# Patient Record
Sex: Male | Born: 1960 | Race: Black or African American | Hispanic: No | Marital: Married | Smoking: Never smoker
Health system: Southern US, Community
[De-identification: ages and names within clinical notes are randomized; demographics above are authoritative.]

## PROBLEM LIST (undated history)

## (undated) DIAGNOSIS — C801 Malignant (primary) neoplasm, unspecified: Secondary | ICD-10-CM

## (undated) DIAGNOSIS — G4730 Sleep apnea, unspecified: Secondary | ICD-10-CM

## (undated) DIAGNOSIS — Z87442 Personal history of urinary calculi: Secondary | ICD-10-CM

## (undated) HISTORY — PX: OTHER SURGICAL HISTORY: SHX169

---

## 2000-06-30 ENCOUNTER — Encounter: Admission: RE | Admit: 2000-06-30 | Discharge: 2000-06-30 | Payer: Self-pay | Admitting: Cardiology

## 2000-06-30 ENCOUNTER — Encounter: Payer: Self-pay | Admitting: Cardiology

## 2000-11-10 ENCOUNTER — Other Ambulatory Visit: Admission: RE | Admit: 2000-11-10 | Discharge: 2000-11-10 | Payer: Self-pay | Admitting: Urology

## 2000-12-16 ENCOUNTER — Encounter: Payer: Self-pay | Admitting: Urology

## 2000-12-16 ENCOUNTER — Encounter: Admission: RE | Admit: 2000-12-16 | Discharge: 2000-12-16 | Payer: Self-pay | Admitting: Urology

## 2002-10-28 ENCOUNTER — Ambulatory Visit (HOSPITAL_COMMUNITY): Admission: RE | Admit: 2002-10-28 | Discharge: 2002-10-28 | Payer: Self-pay | Admitting: Otolaryngology

## 2004-04-16 DIAGNOSIS — C679 Malignant neoplasm of bladder, unspecified: Secondary | ICD-10-CM

## 2004-04-16 HISTORY — DX: Malignant neoplasm of bladder, unspecified: C67.9

## 2004-04-28 ENCOUNTER — Ambulatory Visit (HOSPITAL_COMMUNITY): Admission: RE | Admit: 2004-04-28 | Discharge: 2004-04-28 | Payer: Self-pay | Admitting: Urology

## 2004-04-28 ENCOUNTER — Ambulatory Visit (HOSPITAL_BASED_OUTPATIENT_CLINIC_OR_DEPARTMENT_OTHER): Admission: RE | Admit: 2004-04-28 | Discharge: 2004-04-28 | Payer: Self-pay | Admitting: Urology

## 2005-06-29 ENCOUNTER — Ambulatory Visit (HOSPITAL_BASED_OUTPATIENT_CLINIC_OR_DEPARTMENT_OTHER): Admission: RE | Admit: 2005-06-29 | Discharge: 2005-06-29 | Payer: Self-pay | Admitting: Specialist

## 2005-06-29 ENCOUNTER — Ambulatory Visit (HOSPITAL_COMMUNITY): Admission: RE | Admit: 2005-06-29 | Discharge: 2005-06-29 | Payer: Self-pay | Admitting: Specialist

## 2012-06-15 ENCOUNTER — Other Ambulatory Visit: Payer: Self-pay | Admitting: Cardiology

## 2012-06-15 ENCOUNTER — Ambulatory Visit
Admission: RE | Admit: 2012-06-15 | Discharge: 2012-06-15 | Disposition: A | Discharge status: Home / Self Care | Payer: BC Managed Care – PPO | Source: Ambulatory Visit | Attending: Cardiology | Admitting: Cardiology

## 2012-06-15 DIAGNOSIS — R079 Chest pain, unspecified: Secondary | ICD-10-CM

## 2012-06-15 DIAGNOSIS — R0600 Dyspnea, unspecified: Secondary | ICD-10-CM

## 2012-06-30 ENCOUNTER — Other Ambulatory Visit: Payer: Self-pay | Admitting: Cardiology

## 2012-06-30 DIAGNOSIS — R918 Other nonspecific abnormal finding of lung field: Secondary | ICD-10-CM

## 2012-07-14 ENCOUNTER — Other Ambulatory Visit: Payer: Self-pay | Admitting: Gastroenterology

## 2012-08-08 ENCOUNTER — Other Ambulatory Visit: Payer: BC Managed Care – PPO

## 2014-03-16 ENCOUNTER — Other Ambulatory Visit: Payer: Self-pay | Admitting: Cardiology

## 2014-03-16 ENCOUNTER — Ambulatory Visit
Admission: RE | Admit: 2014-03-16 | Discharge: 2014-03-16 | Disposition: A | Discharge status: Home / Self Care | Payer: BC Managed Care – PPO | Source: Ambulatory Visit | Attending: Cardiology | Admitting: Cardiology

## 2014-03-16 DIAGNOSIS — I10 Essential (primary) hypertension: Secondary | ICD-10-CM

## 2014-11-09 IMAGING — CR DG CHEST 2V
2 series · 2 of 2 positions shown · non-contrast
Comparison: PA and lateral chest 06/15/2012.

CLINICAL DATA: Hypertension.  Cough for 2 weeks.  Chest discomfort.

EXAM:
CHEST  2 VIEW

[w chest pa]
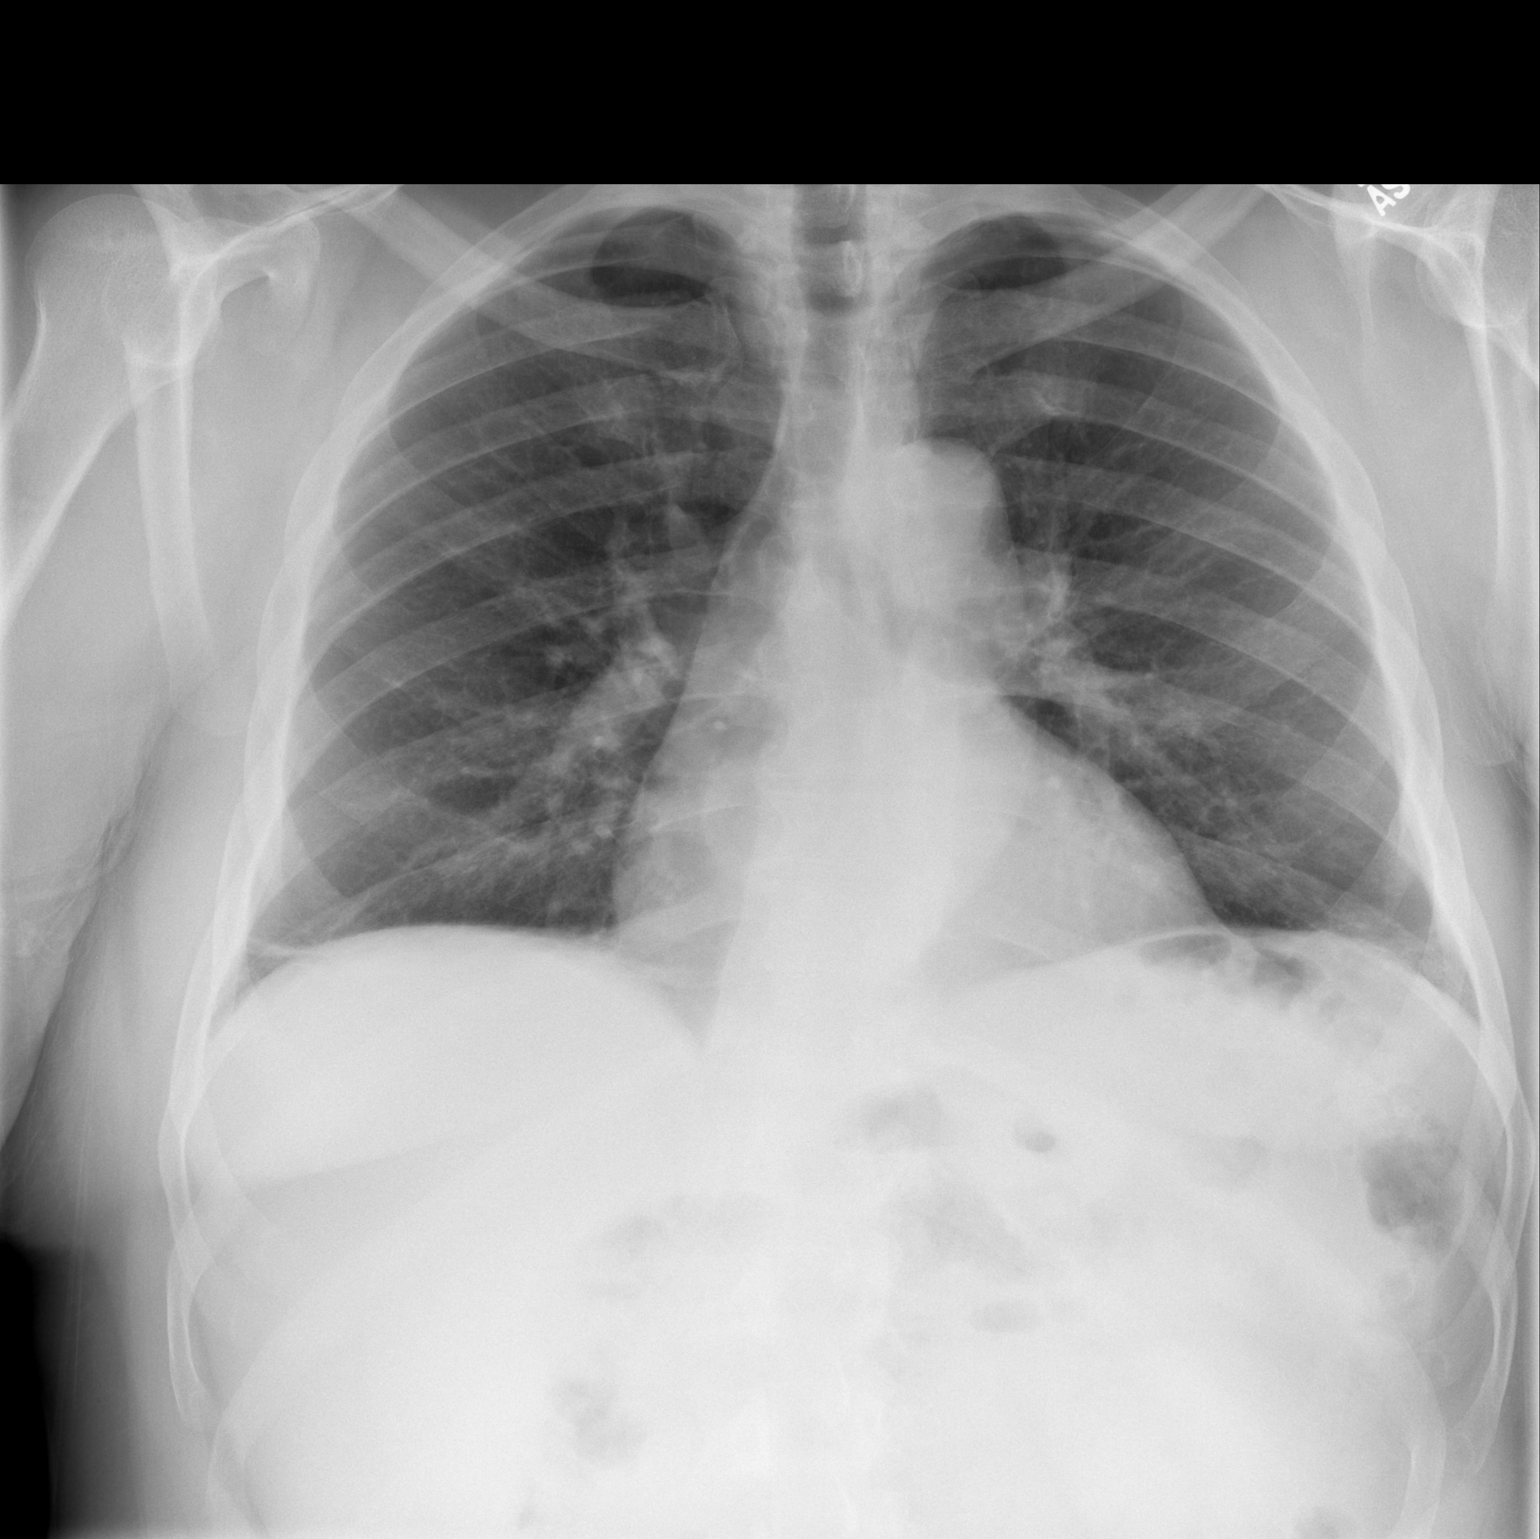

[w chest lat]
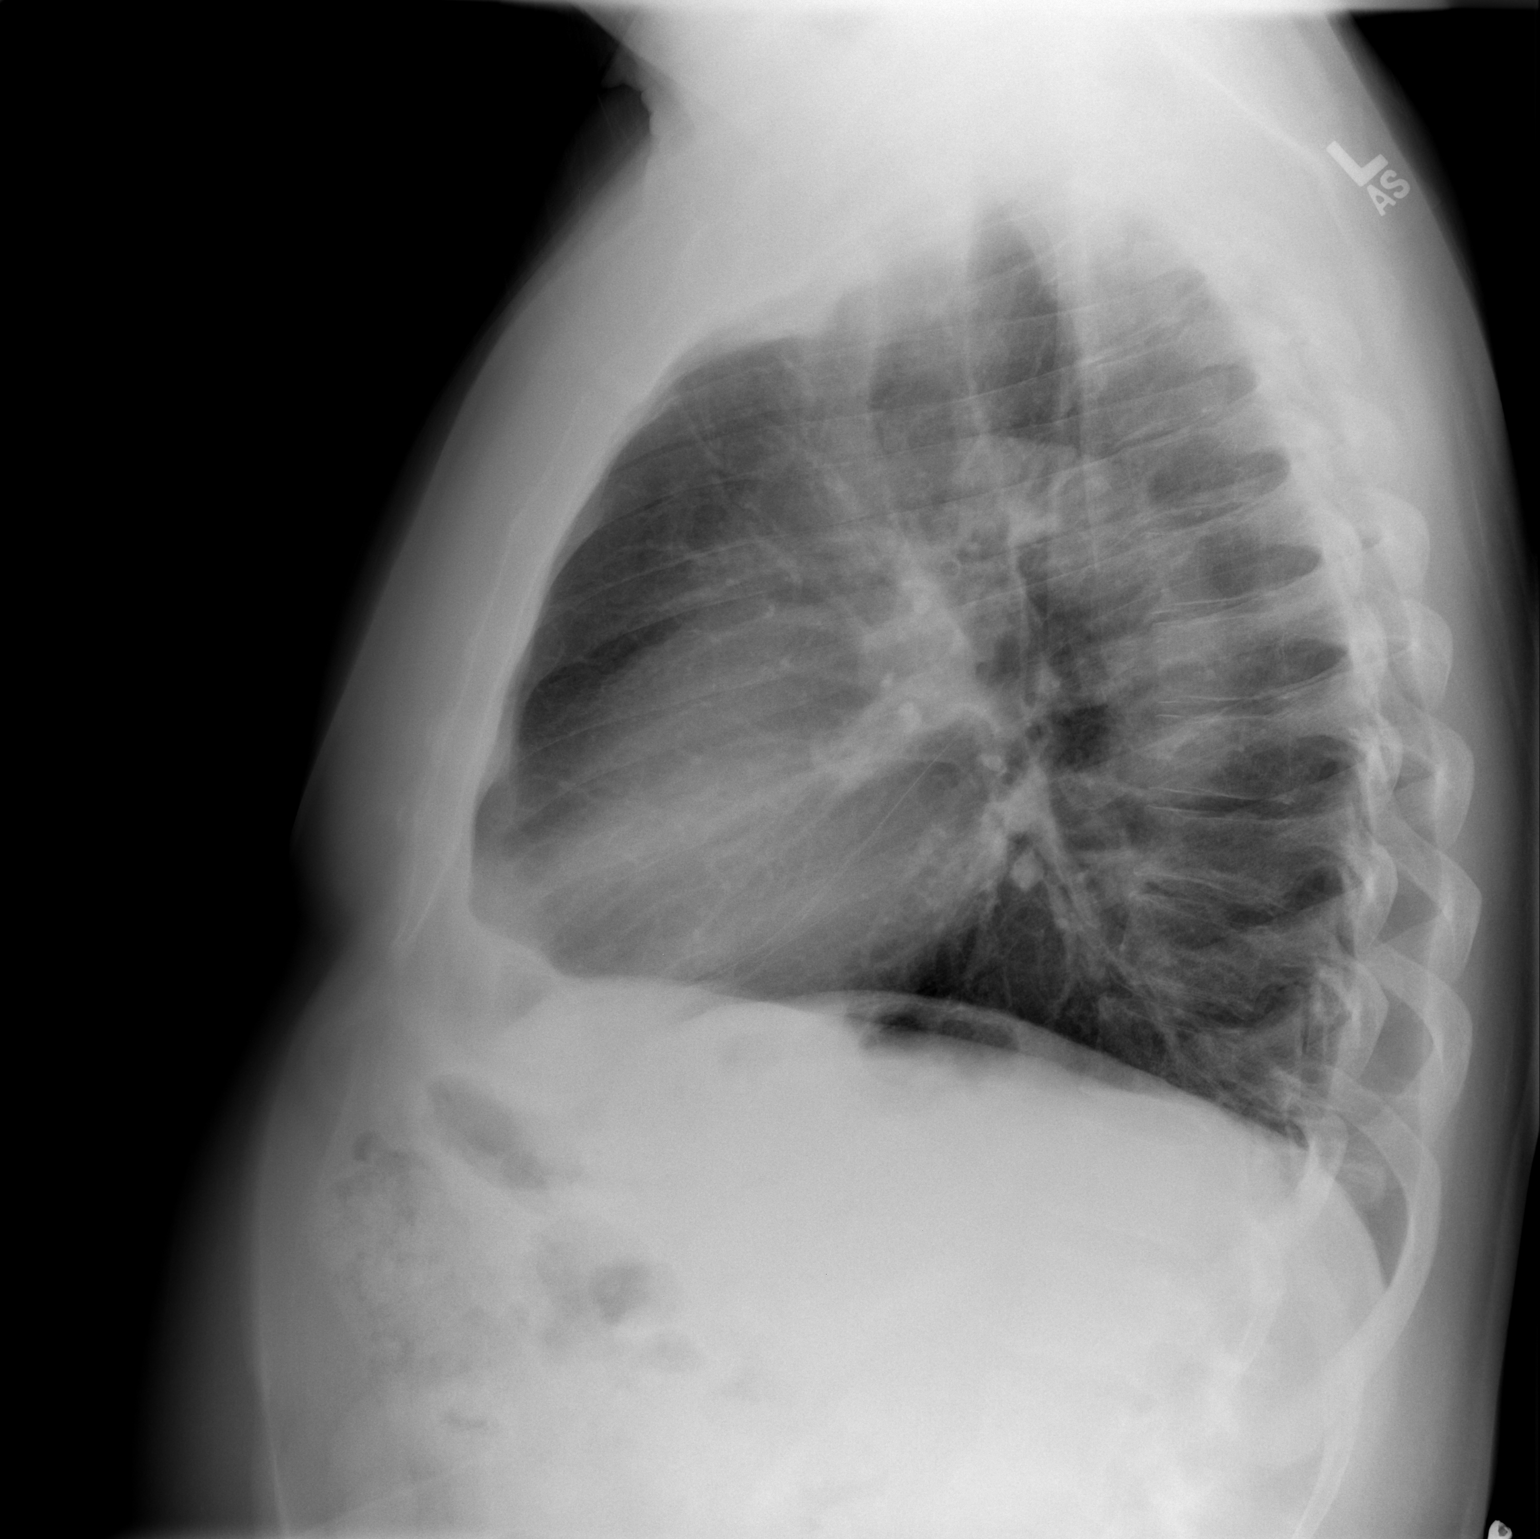

[2 of 2 positions shown; findings below may reference images not displayed]

FINDINGS: Very mild linear atelectasis is present in the bases. The lungs are
otherwise clear. Heart size is normal. No pneumothorax or pleural
effusion.
IMPRESSION: No acute disease.

## 2020-02-08 ENCOUNTER — Ambulatory Visit: Payer: Self-pay | Attending: Internal Medicine

## 2020-02-08 DIAGNOSIS — Z23 Encounter for immunization: Secondary | ICD-10-CM

## 2020-02-08 NOTE — Progress Notes (Signed)
   Covid-19 Vaccination Clinic  Name:  Joel [name]    MRN: [id number] DOB: 18-Jul-1961  02/08/2020  [name] was observed post Covid-19 immunization for 15 minutes without incident. He was provided with Vaccine Information Sheet and instruction to access the V-Safe system.   [name] was instructed to call 911 with any severe reactions post vaccine: Marland Kitchen Difficulty breathing  . Swelling of face and throat  . A fast heartbeat  . A bad rash all over body  . Dizziness and weakness   Immunizations Administered    Name Date Dose VIS Date Route   Pfizer COVID-19 Vaccine 02/08/2020  3:15 PM 0.3 mL 10/27/2019 Intramuscular   Manufacturer: [employer], [employer]   Lot: [id number]   NDC: [address]-2

## 2020-03-04 ENCOUNTER — Ambulatory Visit: Payer: Self-pay | Attending: Internal Medicine

## 2020-03-04 DIAGNOSIS — Z23 Encounter for immunization: Secondary | ICD-10-CM

## 2020-03-04 NOTE — Progress Notes (Signed)
   Covid-19 Vaccination Clinic  Name:  Joel [name]    MRN: [id number] DOB: 1961/09/12  03/04/2020  Mr. Joel [name] was observed post Covid-19 immunization for 15 minutes without incident. He was provided with Vaccine Information Sheet and instruction to access the V-Safe system.   Mr. Joel [name] was instructed to call 911 with any severe reactions post vaccine: Marland Kitchen Difficulty breathing  . Swelling of face and throat  . A fast heartbeat  . A bad rash all over body  . Dizziness and weakness   Immunizations Administered    Name Date Dose VIS Date Route   Pfizer COVID-19 Vaccine 03/04/2020  3:11 PM 0.3 mL 01/10/2019 Intramuscular   Manufacturer: [employer], [employer]   Lot: [id number]   NDC: [address]-2

## 2020-11-16 HISTORY — PX: SHOULDER SURGERY: SHX246

## 2023-08-06 ENCOUNTER — Other Ambulatory Visit: Payer: Self-pay | Admitting: Urology

## 2023-08-26 ENCOUNTER — Encounter (HOSPITAL_BASED_OUTPATIENT_CLINIC_OR_DEPARTMENT_OTHER): Payer: Self-pay | Admitting: Urology

## 2023-08-26 ENCOUNTER — Other Ambulatory Visit: Payer: Self-pay

## 2023-08-26 NOTE — Progress Notes (Signed)
Spoke w/ via phone for pre-op interview---pt Lab needs dos---- I stat (gent)        Lab results------ COVID test -----patient states asymptomatic no test needed Arrive at -------1045 09-03-2023 NPO after MN NO Solid Food.  Clear liquids from MN until---945 Med rec completed Medications to take morning of surgery -----tamsulosin Diabetic medication -----n/a Patient instructed no nail polish to be worn day of surgery Patient instructed to bring photo id and insurance card day of surgery Patient aware to have Driver (ride ) / caregiver    for 24 hours after surgery - Joel [name] wife Patient Special Instructions -----none Pre-Op special Instructions -----none Patient verbalized understanding of instructions that were given at this phone interview. Patient denies chest pain, sob, fever, cough at the interview.

## 2023-09-03 ENCOUNTER — Other Ambulatory Visit: Payer: Self-pay

## 2023-09-03 ENCOUNTER — Encounter (HOSPITAL_BASED_OUTPATIENT_CLINIC_OR_DEPARTMENT_OTHER)
Admission: RE | Disposition: A | Discharge status: Home / Self Care | Payer: Self-pay | Source: Home / Self Care | Attending: Urology

## 2023-09-03 ENCOUNTER — Ambulatory Visit (HOSPITAL_BASED_OUTPATIENT_CLINIC_OR_DEPARTMENT_OTHER): Payer: 59 | Admitting: Anesthesiology

## 2023-09-03 ENCOUNTER — Encounter (HOSPITAL_BASED_OUTPATIENT_CLINIC_OR_DEPARTMENT_OTHER): Payer: Self-pay | Admitting: Urology

## 2023-09-03 ENCOUNTER — Ambulatory Visit (HOSPITAL_BASED_OUTPATIENT_CLINIC_OR_DEPARTMENT_OTHER)
Admission: RE | Admit: 2023-09-03 | Discharge: 2023-09-03 | Disposition: A | Discharge status: Home / Self Care | Payer: 59 | Attending: Urology | Admitting: Urology

## 2023-09-03 DIAGNOSIS — Z6841 Body Mass Index (BMI) 40.0 and over, adult: Secondary | ICD-10-CM | POA: Diagnosis not present

## 2023-09-03 DIAGNOSIS — Z8551 Personal history of malignant neoplasm of bladder: Secondary | ICD-10-CM | POA: Diagnosis not present

## 2023-09-03 DIAGNOSIS — N202 Calculus of kidney with calculus of ureter: Secondary | ICD-10-CM

## 2023-09-03 DIAGNOSIS — E669 Obesity, unspecified: Secondary | ICD-10-CM | POA: Diagnosis not present

## 2023-09-03 DIAGNOSIS — G4730 Sleep apnea, unspecified: Secondary | ICD-10-CM | POA: Insufficient documentation

## 2023-09-03 DIAGNOSIS — Z87442 Personal history of urinary calculi: Secondary | ICD-10-CM | POA: Diagnosis not present

## 2023-09-03 DIAGNOSIS — Z01818 Encounter for other preprocedural examination: Secondary | ICD-10-CM

## 2023-09-03 HISTORY — DX: Malignant (primary) neoplasm, unspecified: C80.1

## 2023-09-03 HISTORY — PX: CYSTOSCOPY/URETEROSCOPY/HOLMIUM LASER/STENT PLACEMENT: SHX6546

## 2023-09-03 HISTORY — DX: Sleep apnea, unspecified: G47.30

## 2023-09-03 HISTORY — DX: Personal history of urinary calculi: Z87.442

## 2023-09-03 LAB — POCT I-STAT, CHEM 8
BUN: 15 mg/dL (ref 8–23)
Calcium, Ion: 1.19 mmol/L (ref 1.15–1.40)
Chloride: 105 mmol/L (ref 98–111)
Creatinine, Ser: 1.2 mg/dL (ref 0.61–1.24)
Glucose, Bld: 96 mg/dL (ref 70–99)
HCT: 40 % (ref 39.0–52.0)
Hemoglobin: 13.6 g/dL (ref 13.0–17.0)
Potassium: 4.2 mmol/L (ref 3.5–5.1)
Sodium: 141 mmol/L (ref 135–145)
TCO2: 21 mmol/L — ABNORMAL LOW (ref 22–32)

## 2023-09-03 SURGERY — CYSTOSCOPY/URETEROSCOPY/HOLMIUM LASER/STENT PLACEMENT
Anesthesia: General | Site: Ureter | Laterality: Bilateral

## 2023-09-03 MED ORDER — PHENYLEPHRINE HCL (PRESSORS) 10 MG/ML IV SOLN
INTRAVENOUS | Status: AC
  Filled 2023-09-03: qty 1

## 2023-09-03 MED ORDER — GENTAMICIN SULFATE 40 MG/ML IJ SOLN
5.0000 mg/kg | INTRAVENOUS | Status: AC
  Administered 2023-09-03: 480 mg via INTRAVENOUS
  Filled 2023-09-03: qty 12

## 2023-09-03 MED ORDER — LACTATED RINGERS IV SOLN: INTRAVENOUS | Status: DC

## 2023-09-03 MED ORDER — MIDAZOLAM HCL 5 MG/5ML IJ SOLN
INTRAMUSCULAR | Status: DC | PRN
  Administered 2023-09-03: 2 mg via INTRAVENOUS

## 2023-09-03 MED ORDER — LIDOCAINE HCL (PF) 2 % IJ SOLN
INTRAMUSCULAR | Status: AC
  Filled 2023-09-03: qty 5

## 2023-09-03 MED ORDER — PHENYLEPHRINE HCL-NACL 20-0.9 MG/250ML-% IV SOLN
INTRAVENOUS | Status: DC | PRN
Start: 2023-09-03 — End: 2023-09-03
  Administered 2023-09-03: 20 ug/min via INTRAVENOUS

## 2023-09-03 MED ORDER — OXYCODONE-ACETAMINOPHEN 5-325 MG PO TABS: 1.0000 | ORAL_TABLET | Freq: Four times a day (QID) | ORAL | 0 refills | Status: DC | PRN

## 2023-09-03 MED ORDER — SODIUM CHLORIDE 0.9 % IR SOLN
Status: DC | PRN
  Administered 2023-09-03: 3000 mL

## 2023-09-03 MED ORDER — ONDANSETRON HCL 4 MG/2ML IJ SOLN
INTRAMUSCULAR | Status: AC
  Filled 2023-09-03: qty 2

## 2023-09-03 MED ORDER — MIDAZOLAM HCL 2 MG/2ML IJ SOLN
INTRAMUSCULAR | Status: AC
  Filled 2023-09-03: qty 2

## 2023-09-03 MED ORDER — PHENYLEPHRINE HCL (PRESSORS) 10 MG/ML IV SOLN
INTRAVENOUS | Status: DC | PRN
Start: 2023-09-03 — End: 2023-09-03
  Administered 2023-09-03: 240 ug via INTRAVENOUS
  Administered 2023-09-03: 80 ug via INTRAVENOUS

## 2023-09-03 MED ORDER — LIDOCAINE HCL (CARDIAC) PF 100 MG/5ML IV SOSY
PREFILLED_SYRINGE | INTRAVENOUS | Status: DC | PRN
  Administered 2023-09-03: 100 mg via INTRAVENOUS

## 2023-09-03 MED ORDER — FENTANYL CITRATE (PF) 100 MCG/2ML IJ SOLN
INTRAMUSCULAR | Status: DC | PRN
  Administered 2023-09-03: 25 ug via INTRAVENOUS

## 2023-09-03 MED ORDER — ACETAMINOPHEN 10 MG/ML IV SOLN
INTRAVENOUS | Status: AC
  Filled 2023-09-03: qty 100

## 2023-09-03 MED ORDER — ACETAMINOPHEN 500 MG PO TABS: 1000.0000 mg | ORAL_TABLET | Freq: Once | ORAL | Status: DC

## 2023-09-03 MED ORDER — PROPOFOL 10 MG/ML IV BOLUS
INTRAVENOUS | Status: DC | PRN
  Administered 2023-09-03: 200 mg via INTRAVENOUS

## 2023-09-03 MED ORDER — SENNOSIDES-DOCUSATE SODIUM 8.6-50 MG PO TABS: 1.0000 | ORAL_TABLET | Freq: Two times a day (BID) | ORAL | 0 refills | Status: AC

## 2023-09-03 MED ORDER — DEXAMETHASONE SODIUM PHOSPHATE 10 MG/ML IJ SOLN
INTRAMUSCULAR | Status: AC
  Filled 2023-09-03: qty 1

## 2023-09-03 MED ORDER — IOHEXOL 300 MG/ML  SOLN
INTRAMUSCULAR | Status: DC | PRN
  Administered 2023-09-03: 8 mL via URETHRAL

## 2023-09-03 MED ORDER — DEXAMETHASONE SODIUM PHOSPHATE 4 MG/ML IJ SOLN
INTRAMUSCULAR | Status: DC | PRN
  Administered 2023-09-03: 10 mg via INTRAVENOUS

## 2023-09-03 MED ORDER — FENTANYL CITRATE (PF) 100 MCG/2ML IJ SOLN
INTRAMUSCULAR | Status: AC
  Filled 2023-09-03: qty 2

## 2023-09-03 MED ORDER — ONDANSETRON HCL 4 MG/2ML IJ SOLN
INTRAMUSCULAR | Status: DC | PRN
  Administered 2023-09-03: 4 mg via INTRAVENOUS

## 2023-09-03 MED ORDER — 0.9 % SODIUM CHLORIDE (POUR BTL) OPTIME
TOPICAL | Status: DC | PRN
  Administered 2023-09-03: 500 mL

## 2023-09-03 MED ORDER — KETOROLAC TROMETHAMINE 10 MG PO TABS: 10.0000 mg | ORAL_TABLET | Freq: Four times a day (QID) | ORAL | 0 refills | Status: DC | PRN

## 2023-09-03 MED ORDER — ACETAMINOPHEN 10 MG/ML IV SOLN
INTRAVENOUS | Status: DC | PRN
Start: 2023-09-03 — End: 2023-09-03
  Administered 2023-09-03: 1000 mg via INTRAVENOUS

## 2023-09-03 SURGICAL SUPPLY — 29 items
BAG DRAIN URO-CYSTO SKYTR STRL (DRAIN) ×1 IMPLANT
BAG DRN UROCATH (DRAIN) ×1
BASKET LASER NITINOL 1.9FR (BASKET) IMPLANT
BSKT STON RTRVL 120 1.9FR (BASKET)
CATH URETL OPEN END 6FR 70 (CATHETERS) IMPLANT
CLOTH BEACON ORANGE TIMEOUT ST (SAFETY) ×1 IMPLANT
GLOVE BIO SURGEON STRL SZ7.5 (GLOVE) ×1 IMPLANT
GLOVE BIOGEL PI IND STRL 7.0 (GLOVE) IMPLANT
GLOVE SURG SS PI 6.5 STRL IVOR (GLOVE) IMPLANT
GOWN STRL REUS W/ TWL LRG LVL3 (GOWN DISPOSABLE) IMPLANT
GOWN STRL REUS W/TWL LRG LVL3 (GOWN DISPOSABLE) ×2 IMPLANT
GUIDEWIRE ANG ZIPWIRE 038X150 (WIRE) ×1 IMPLANT
GUIDEWIRE STR DUAL SENSOR (WIRE) ×1 IMPLANT
IV NS 1000ML (IV SOLUTION)
IV NS 1000ML BAXH (IV SOLUTION) ×1 IMPLANT
IV NS IRRIG 3000ML ARTHROMATIC (IV SOLUTION) ×1 IMPLANT
KIT TURNOVER CYSTO (KITS) ×1 IMPLANT
LASER FIB FLEXIVA PULSE ID 365 (Laser) IMPLANT
MANIFOLD NEPTUNE II (INSTRUMENTS) ×1 IMPLANT
NS IRRIG 500ML POUR BTL (IV SOLUTION) ×1 IMPLANT
PACK CYSTO (CUSTOM PROCEDURE TRAY) ×1 IMPLANT
SLEEVE SCD COMPRESS KNEE MED (STOCKING) ×1 IMPLANT
STENT POLARIS 5FRX26 (STENTS) IMPLANT
SYR 10ML LL (SYRINGE) ×1 IMPLANT
TRACTIP FLEXIVA PULS ID 200XHI (Laser) IMPLANT
TRACTIP FLEXIVA PULSE ID 200 (Laser)
TUBE CONNECTING 12X1/4 (SUCTIONS) ×1 IMPLANT
TUBE PU 8FR 16IN ENFIT (TUBING) IMPLANT
TUBING UROLOGY SET (TUBING) ×1 IMPLANT

## 2023-09-03 NOTE — Brief Op Note (Signed)
09/03/2023  12:51 PM  PATIENT:  Joel [name]  62 y.o. male  PRE-OPERATIVE DIAGNOSIS:  BILATERAL RENAL AND URETERAL CALCULUS  POST-OPERATIVE DIAGNOSIS:  BILATERAL RENAL AND URETERAL CALCULUS  PROCEDURE: cystoscopy, BILATERAL retrograde pyelograms / diagnostic ureteroscopy / ureteral stent placemet  SURGEON:  Surgeons and Role:    * Jeter Tomey, Delbert Phenix., MD - Primary  PHYSICIAN ASSISTANT:   ASSISTANTS: none   ANESTHESIA:   epidural  EBL:  5 mL   BLOOD ADMINISTERED:none  DRAINS: none   LOCAL MEDICATIONS USED:  NONE  SPECIMEN:  No Specimen  DISPOSITION OF SPECIMEN:  N/A  COUNTS:  YES  TOURNIQUET:  * No tourniquets in log *  DICTATION: .Other Dictation: Dictation Number [id number]  PLAN OF CARE: Discharge to home after PACU  PATIENT DISPOSITION:  PACU - hemodynamically stable.   Delay start of Pharmacological VTE agent (>24hrs) due to surgical blood loss or risk of bleeding: yes

## 2023-09-03 NOTE — Op Note (Unsigned)
NAMEMARTAVION, BEECK MEDICAL RECORD NO: [id number] ACCOUNT NO: [id number] DATE OF BIRTH: [birth year] FACILITY: [facility] LOCATION: [facility] PHYSICIAN: Sebastian Ache, MD  Operative Report   DATE OF PROCEDURE: 09/03/2023  PREOPERATIVE DIAGNOSIS:  Right ureteral, left renal stone.  POSTOPERATIVE DIAGNOSIS:  Right ureteral, left renal stone.  PROCEDURE PERFORMED:   1.  Cystoscopy with bilateral retrograde pyelograms with interpretation. 2.  Bilateral diagnostic ureteroscopy. 3.  Insertion of bilateral ureteral stents.  ESTIMATED BLOOD LOSS:  Nil.  COMPLICATIONS:  None.  SPECIMEN:  None.  FINDINGS: 1.  No hydronephrosis bilaterally. 2.  Very tight ureters beginning at the mid ureter proximal would not accommodate even the single channel ureteroscope to the level of the kidney or stone. 3.  Successful placement of bilateral ureteral stents, proximal end in the renal pelvis, distal end in urinary bladder.  No tether.  INDICATIONS:  The patient is a 62 year old man who was found on workup of colicky flank pain to have a right ureteral stone proximally and left renal stone at a referring facility.  He was sent for definitive management.  He has been on appropriate trial  of medical therapy for his ureteral stone.  He has failed to pass this and does still have some occasional colic.  He underwent office KUB, which suggested possible distal progression of right ureteral stone versus phlebolith as his prior imaging could not be  verified and given the multifocality of the stones, failure to pass with medical therapy, options were discussed including continued medical passage versus definitive management with ureteroscopy, unilateral versus bilateral and wished to proceed with  bilateral ureteroscopy with goal of stone free.  He presents for this today.  Informed consent was obtained and placed in medical record.  PROCEDURE DETAILS:  The patient being Joel [name] and procedure being  bilateral ureteroscopic stone manipulation was confirmed.  Procedure timeout was performed.  Intravenous antibiotics administered.  General LMA anesthesia induced.  The patient was  placed into a low lithotomy position.  Sterile field was created, prepped and draped the patient's penis, perineum, and proximal thighs using iodine.  Cystourethroscopy performed using 21-French rigid cystoscope with offset lens per standard fashion,  posterior urethra was unremarkable.  Inspection of urinary bladder revealed no diverticula, calcifications, papillary lesions.  Ureteral orifices were single.  The right ureteral orifice was cannulated with 6-French end-hole catheter and right retrograde  pyelogram was obtained.  Right retrograde pyelogram demonstrated single right ureter, single system right kidney.  No obvious filling defects or narrowing noted.  No hydronephrosis was noted. 0.038 ZIPwire was advanced to lower pole, set aside as a safety wire.  Next, left  retrograde pyelogram was obtained.  Left retrograde pyelogram demonstrated single left ureter, single system left kidney.  No obvious filling defects or narrowing noted.  A 0.038 ZIPwire was advanced to lower pole, set aside as a safety wire.  An 8-French feeding tube placed in the urinary  bladder for pressure release and semirigid ureteroscopy performed of the distal two-thirds left ureter alongside a separate sensor working wire.  No mucosal abnormalities were found.  The ureter was somewhat narrow in the upper reaches of the  ureteroscope, but not pathologically. Semirigid ureteroscopy was similarly performed to distal two-thirds right ureter alongside a separate sensor working wire where similarly more proximal ureter was somewhat narrow, but not pathologically. Given the  mild narrowing, it was felt that access sheath would not be advisable and a single attempt was made at each side to pass the single channel,  smallest caliber, flexible digital  ureteroscope to the level of proximal ureter and again this met resistance in  the mid ureter and visualization corroborated likely physiologic narrowing in this area bilaterally.  As the ultimate goal is to render him to verify stone free, it was felt that the safest means of proceeding would be to place a stent today, allow for  passive dilation and then reattempted ureteroscopy in several weeks.  As such, 5 x 26 Polaris type stents were carefully placed over the remaining safety wires using fluoroscopic guidance.  Good proximal and distal plane were noted.  Procedure was  terminated.  The patient tolerated procedure well, no immediate periprocedural complications.  The patient was taken to postanesthesia care in stable condition.  Plan for discharge home.   SHY D: 09/03/2023 12:57:17 pm T: 09/03/2023 2:21:00 pm  JOB: [id number]/ [id number]

## 2023-09-03 NOTE — H&P (Signed)
Joel [name] is an 62 y.o. male.    Chief Complaint: Pre-OP BILATERAL Ureteroscopic Stone Manipulation  HPI:   1 - Urolithiasis - Rt prox 5mm ureteral, Lt lower 1cm renal (no hydro) by CT at [facility] 9/11 per report (images NOT avail for review). UA withtou infecitous parameters, Cr 1.5. KUB 9/17 with Rt ureteral stone now distal ureter (femoral head level), colic controlled.   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 Cysto 2018 without recurrence.   PMH sig for obesity, facial hemangioma. NO PCP at present / does not get regular primary care. 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   Today " Joel [name] " is seen to proceed with BILATERAL ureteroscopic stone manipulation. No interval fevers. Most recent UA without infectious parameters.   Past Medical History:  Diagnosis Date   Bladder cancer (HCC) 04/2004   [facility])    History of kidney stones    Sleep apnea     Past Surgical History:  Procedure Laterality Date   lipoma removed from neck     SHOULDER SURGERY Right 2022   surgery for bladder cancer      History reviewed. No pertinent family history. Social History:  reports that he has never smoked. He has never used smokeless tobacco. He reports that he does not currently use alcohol. He reports that he does not currently use drugs.  Allergies: No Known Allergies  No medications prior to admission.    No results found for this or any previous visit (from the past 48 hour(s)). No results found.  Review of Systems  Constitutional:  Negative for chills and fever.  Genitourinary:  Positive for flank pain.  All other systems reviewed and are negative.   Height 5\' 11"  (1.803 m), weight 128.8 kg. Physical Exam Vitals reviewed.  Constitutional:      Comments: Very pleasant, at baseline. Wife at bedside as well.   HENT:     Head: Normocephalic.     Comments: Stable facial hemangioma Eyes:      Pupils: Pupils are equal, round, and reactive to light.  Cardiovascular:     Rate and Rhythm: Normal rate.  Pulmonary:     Effort: Pulmonary effort is normal.  Abdominal:     General: Abdomen is flat.  Genitourinary:    Comments: Minimal CVAT at present Musculoskeletal:        General: Normal range of motion.     Cervical back: Normal range of motion.  Neurological:     General: No focal deficit present.     Mental Status: He is alert.  Psychiatric:        Mood and Affect: Mood normal.      Assessment/Plan  Proceed as planned with BILATERAL ureteroscopic stone manipulaiton. Risks, benefits, alternatives, expected peri-op course discussed previously and reiterated today.   Loletta Parish., MD 09/03/2023, 7:01 AM

## 2023-09-03 NOTE — Anesthesia Preprocedure Evaluation (Signed)
Anesthesia Evaluation    Airway Mallampati: II  TM Distance: >3 FB Neck ROM: Full    Dental no notable dental hx. (+) Dental Advisory Given, Teeth Intact   Pulmonary    Pulmonary exam normal breath sounds clear to auscultation       Cardiovascular Normal cardiovascular exam Rhythm:Regular Rate:Normal     Neuro/Psych    GI/Hepatic   Endo/Other    Renal/GU      Musculoskeletal   Abdominal  (+) + obese  Peds  Hematology   Anesthesia Other Findings   Reproductive/Obstetrics                              Anesthesia Physical Anesthesia Plan  ASA: 3  Anesthesia Plan: General   Post-op Pain Management: Tylenol PO (pre-op)*   Induction: Intravenous  PONV Risk Score and Plan: 3 and Ondansetron, Dexamethasone, Treatment may vary due to age or medical condition and Midazolam  Airway Management Planned: LMA  Additional Equipment:   Intra-op Plan:   Post-operative Plan: Extubation in OR  Informed Consent: I have reviewed the patients History and Physical, chart, labs and discussed the procedure including the risks, benefits and alternatives for the proposed anesthesia with the patient or authorized representative who has indicated his/her understanding and acceptance.     Dental advisory given  Plan Discussed with: CRNA  Anesthesia Plan Comments:          Anesthesia Quick Evaluation

## 2023-09-03 NOTE — Anesthesia Procedure Notes (Signed)
Procedure Name: LMA Insertion Date/Time: 09/03/2023 12:19 PM  Performed by: Carlos American, CRNAPre-anesthesia Checklist: Patient identified, Emergency Drugs available, Suction available and Patient being monitored Patient Re-evaluated:Patient Re-evaluated prior to induction Oxygen Delivery Method: Circle System Utilized Preoxygenation: Pre-oxygenation with 100% oxygen Induction Type: IV induction Ventilation: Mask ventilation without difficulty LMA: LMA inserted LMA Size: 5.0 Number of attempts: 1 Placement Confirmation: positive ETCO2 Tube secured with: Tape Dental Injury: Teeth and Oropharynx as per pre-operative assessment

## 2023-09-03 NOTE — Discharge Instructions (Addendum)
1 - You may have urinary urgency (bladder spasms) and bloody urine on / off with stent in place. This is normal.  2 - Call MD or go to ER for fever >102, severe pain / nausea / vomiting not relieved by medications, or acute change in medical status   DO NOT TAKE TYLENOL BEFORE 7p today.    [facility] [phone] Post Ureteroscopy With or Without Stent Instructions  Definitions:  Ureter: The duct that transports urine from the kidney to the bladder. Stent:   A plastic hollow tube that is placed into the ureter, from the kidney to the bladder to prevent the ureter from swelling shut.  GENERAL INSTRUCTIONS:  Despite the fact that no skin incisions were used, the area around the ureter and bladder is raw and irritated. The stent is a foreign body which will further irritate the bladder wall. This irritation is manifested by increased frequency of urination, both day and night, and by an increase in the urge to urinate. In some, the urge to urinate is present almost always. Sometimes the urge is strong enough that you may not be able to stop yourself from urinating. The only real cure is to remove the stent and then give time for the bladder wall to heal which can't be done until the danger of the ureter swelling shut has passed, which varies.  You may see some blood in your urine while the stent is in place and a few days afterwards. Do not be alarmed, even if the urine was clear for a while. Get off your feet and drink lots of fluids until clearing occurs. If you start to pass clots or don't improve, call us.  DIET: You may return to your normal diet immediately. Because of the raw surface of your bladder, alcohol, spicy foods, acid type foods and drinks with caffeine may cause irritation or frequency and should be used in moderation. To keep your urine flowing freely and to avoid constipation, drink plenty of fluids during the day ( 8-10 glasses ). Tip: Avoid cranberry juice  because it is very acidic.  ACTIVITY: Your physical activity doesn't need to be restricted. However, if you are very active, you may see some blood in your urine. We suggest that you reduce your activity under these circumstances until the bleeding has stopped.  BOWELS: It is important to keep your bowels regular during the postoperative period. Straining with bowel movements can cause bleeding. A bowel movement every other day is reasonable. Use a mild laxative if needed, such as Milk of Magnesia 2-3 tablespoons, or 2 Dulcolax tablets. Call if you continue to have problems. If you have been taking narcotics for pain, before, during or after your surgery, you may be constipated. Take a laxative if necessary.   MEDICATION: You should resume your pre-surgery medications unless told not to. In addition you will often be given an antibiotic to prevent infection. These should be taken as prescribed until the bottles are finished unless you are having an unusual reaction to one of the drugs.  PROBLEMS YOU SHOULD REPORT TO [place]: Fevers over 100.5 Fahrenheit. Heavy bleeding, or clots ( See above notes about blood in urine ). Inability to urinate. Drug reactions ( hives, rash, nausea, vomiting, diarrhea ). Severe burning or pain with urination that is not improving.  FOLLOW-UP: You will need a follow-up appointment to monitor your progress. Call for this appointment at the number listed above. Usually the first appointment will be about three to  fourteen days after your surgery.     Post Anesthesia Home Care Instructions  Activity: Get plenty of rest for the remainder of the day. A responsible adult should stay with you for 24 hours following the procedure.  For the next 24 hours, DO NOT: -Drive a car -Advertising copywriter -Drink alcoholic beverages -Take any medication unless instructed by your physician -Make any legal decisions or sign important papers.  Meals: Start with liquid foods such  as gelatin or soup. Progress to regular foods as tolerated. Avoid greasy, spicy, heavy foods. If nausea and/or vomiting occur, drink only clear liquids until the nausea and/or vomiting subsides. Call your physician if vomiting continues.  Special Instructions/Symptoms: Your throat may feel dry or sore from the anesthesia or the breathing tube placed in your throat during surgery. If this causes discomfort, gargle with warm salt water. The discomfort should disappear within 24 hours.

## 2023-09-03 NOTE — Transfer of Care (Signed)
Immediate Anesthesia Transfer of Care Note  Patient: Carney Harder  Procedure(s) Performed: CYSTOSCOPY, BILATERAL DIAGNOSTIC URETEROSCOPY, BILATERAL RETROGRADE PYELOGRAM, BILATERAL URETERAL STENT PLACEMENT (Bilateral: Ureter)  Patient Location: PACU  Anesthesia Type:General  Level of Consciousness: drowsy and patient cooperative  Airway & Oxygen Therapy: Patient Spontanous Breathing  Post-op Assessment: Report given to RN and Post -op Vital signs reviewed and stable  Post vital signs: Reviewed and stable  Last Vitals:  Vitals Value Taken Time  BP 119/83 09/03/23 1306  Temp    Pulse 62 09/03/23 1310  Resp 18 09/03/23 1310  SpO2 90 % 09/03/23 1310  Vitals shown include unfiled device data.  Last Pain:  Vitals:   09/03/23 1122  TempSrc: Oral  PainSc: 4       Patients Stated Pain Goal: 0 (09/03/23 1122)  Complications: No notable events documented.

## 2023-09-06 NOTE — Anesthesia Postprocedure Evaluation (Signed)
Anesthesia Post Note  Patient: Joel [name]  Procedure(s) Performed: CYSTOSCOPY, BILATERAL DIAGNOSTIC URETEROSCOPY, BILATERAL RETROGRADE PYELOGRAM, BILATERAL URETERAL STENT PLACEMENT (Bilateral: Ureter)     Patient location during evaluation: PACU Anesthesia Type: General Level of consciousness: sedated and patient cooperative Pain management: pain level controlled Vital Signs Assessment: post-procedure vital signs reviewed and stable Respiratory status: spontaneous breathing Cardiovascular status: stable Anesthetic complications: no   No notable events documented.  Last Vitals:  Vitals:   09/03/23 1409 09/03/23 1427  BP: 100/82 109/68  Pulse: (!) 54 (!) 54  Resp: 16   Temp: 36.5 C   SpO2: 95%     Last Pain:  Vitals:   09/03/23 1409  TempSrc:   PainSc: 0-No pain                 Lewie Loron

## 2023-09-06 NOTE — Plan of Care (Signed)
[facility] Tonsillectomy/Adenoidectomy, Postoperative PEDS care plan entered in error.

## 2023-09-07 ENCOUNTER — Encounter (HOSPITAL_BASED_OUTPATIENT_CLINIC_OR_DEPARTMENT_OTHER): Payer: Self-pay | Admitting: Urology

## 2023-09-16 ENCOUNTER — Other Ambulatory Visit: Payer: Self-pay | Admitting: Urology

## 2023-09-27 NOTE — Patient Instructions (Signed)
SURGICAL WAITING ROOM VISITATION  Patients having surgery or a procedure may have no more than 2 support people in the waiting area - these visitors may rotate.    Children under the age of 66 must have an adult with them who is not the patient.  Due to an increase in RSV and influenza rates and associated hospitalizations, children ages 19 and under may not visit patients in [facility] hospitals.  If the patient needs to stay at the hospital during part of their recovery, the visitor guidelines for inpatient rooms apply. Pre-op nurse will coordinate an appropriate time for 1 support person to accompany patient in pre-op.  This support person may not rotate.    Please refer to the [facility] website for the visitor guidelines for Inpatients (after your surgery is over and you are in a regular room).       Your procedure is scheduled on:  10/01/2023    Report to [facility] Main Entrance    Report to admitting at   [phone]   Call this number if you have problems the morning of surgery [phone]   Do not eat food  or drink liquids :After Midnight.                             If you have questions, please contact your surgeon's office.        Oral Hygiene is also important to reduce your risk of infection.                                    Remember - BRUSH YOUR TEETH THE MORNING OF SURGERY WITH YOUR REGULAR TOOTHPASTE  DENTURES WILL BE REMOVED PRIOR TO SURGERY PLEASE DO NOT APPLY "Poly grip" OR ADHESIVES!!!   Do NOT smoke after Midnight   Stop all vitamins and herbal supplements 7 days before surgery.   Take these medicines the morning of surgery with A SIP OF WATER:  flomax   DO NOT TAKE ANY ORAL DIABETIC MEDICATIONS DAY OF YOUR SURGERY  Bring CPAP mask and tubing day of surgery.                              You may not have any metal on your body including hair pins, jewelry, and body piercing             Do not wear make-up, lotions, powders,  perfumes/cologne, or deodorant  Do not wear nail polish including gel and S&S, artificial/acrylic nails, or any other type of covering on natural nails including finger and toenails. If you have artificial nails, gel coating, etc. that needs to be removed by a nail salon please have this removed prior to surgery or surgery may need to be canceled/ delayed if the surgeon/ anesthesia feels like they are unable to be safely monitored.   Do not shave  48 hours prior to surgery.               Men may shave face and neck.   Do not bring valuables to the hospital. [facility] IS NOT             RESPONSIBLE   FOR VALUABLES.   Contacts, glasses, dentures or bridgework may not be worn into surgery.   Bring small overnight bag day of  surgery.   DO NOT BRING YOUR HOME MEDICATIONS TO THE HOSPITAL. PHARMACY WILL DISPENSE MEDICATIONS LISTED ON YOUR MEDICATION LIST TO YOU DURING YOUR ADMISSION IN THE HOSPITAL!    Patients discharged on the day of surgery will not be allowed to drive home.  Someone NEEDS to stay with you for the first 24 hours after anesthesia.   Special Instructions: Bring a copy of your healthcare power of attorney and living will documents the day of surgery if you haven't scanned them before.              Please read over the following fact sheets you were given: IF YOU HAVE QUESTIONS ABOUT YOUR PRE-OP INSTRUCTIONS PLEASE CALL [phone]   If you received a COVID test during your pre-op visit  it is requested that you wear a mask when out in public, stay away from anyone that may not be feeling well and notify your surgeon if you develop symptoms. If you test positive for Covid or have been in contact with anyone that has tested positive in the last 10 days please notify you surgeon.    [facility] - Preparing for Surgery Before surgery, you can play an important role.  Because skin is not sterile, your skin needs to be as free of germs as possible.  You can reduce the number of  germs on your skin by washing with CHG (chlorahexidine gluconate) soap before surgery.  CHG is an antiseptic cleaner which kills germs and bonds with the skin to continue killing germs even after washing. Please DO NOT use if you have an allergy to CHG or antibacterial soaps.  If your skin becomes reddened/irritated stop using the CHG and inform your nurse when you arrive at Short Stay. Do not shave (including legs and underarms) for at least 48 hours prior to the first CHG shower.  You may shave your face/neck. Please follow these instructions carefully:  1.  Shower with CHG Soap the night before surgery and the  morning of Surgery.  2.  If you choose to wash your hair, wash your hair first as usual with your  normal  shampoo.  3.  After you shampoo, rinse your hair and body thoroughly to remove the  shampoo.                           4.  Use CHG as you would any other liquid soap.  You can apply chg directly  to the skin and wash                       Gently with a scrungie or clean washcloth.  5.  Apply the CHG Soap to your body ONLY FROM THE NECK DOWN.   Do not use on face/ open                           Wound or open sores. Avoid contact with eyes, ears mouth and genitals (private parts).                       Wash face,  Genitals (private parts) with your normal soap.             6.  Wash thoroughly, paying special attention to the area where your surgery  will be performed.  7.  Thoroughly rinse your body with warm water from the neck  down.  8.  DO NOT shower/wash with your normal soap after using and rinsing off  the CHG Soap.                9.  Pat yourself dry with a clean towel.            10.  Wear clean pajamas.            11.  Place clean sheets on your bed the night of your first shower and do not  sleep with pets. Day of Surgery : Do not apply any lotions/deodorants the morning of surgery.  Please wear clean clothes to the hospital/surgery center.  FAILURE TO FOLLOW THESE  INSTRUCTIONS MAY RESULT IN THE CANCELLATION OF YOUR SURGERY PATIENT SIGNATURE_________________________________  NURSE SIGNATURE__________________________________  ________________________________________________________________________

## 2023-09-27 NOTE — Progress Notes (Signed)
Anesthesia Review:  PCP: Cardiologist : Chest x-ray : EKG : Echo : Stress test: Cardiac Cath :  Activity level:  Sleep Study/ CPAP : Fasting Blood Sugar :      / Checks Blood Sugar -- times a day:   Blood Thinner/ Instructions /Last Dose: ASA / Instructions/ Last Dose :  

## 2023-09-29 ENCOUNTER — Encounter (HOSPITAL_COMMUNITY)
Admission: RE | Admit: 2023-09-29 | Discharge: 2023-09-29 | Disposition: A | Discharge status: Home / Self Care | Payer: 59 | Source: Ambulatory Visit | Attending: Urology

## 2023-09-29 ENCOUNTER — Other Ambulatory Visit: Payer: Self-pay

## 2023-09-29 ENCOUNTER — Encounter (HOSPITAL_COMMUNITY): Payer: Self-pay

## 2023-09-29 VITALS — BP 159/103 | HR 63 | Temp 98.2°F | Resp 16 | Ht 71.0 in | Wt 286.0 lb

## 2023-09-29 DIAGNOSIS — Z01812 Encounter for preprocedural laboratory examination: Secondary | ICD-10-CM | POA: Diagnosis present

## 2023-09-29 DIAGNOSIS — Z01818 Encounter for other preprocedural examination: Secondary | ICD-10-CM | POA: Diagnosis not present

## 2023-09-29 DIAGNOSIS — Z01810 Encounter for preprocedural cardiovascular examination: Secondary | ICD-10-CM | POA: Diagnosis present

## 2023-09-29 LAB — CBC
HCT: 41.6 % (ref 39.0–52.0)
Hemoglobin: 13.8 g/dL (ref 13.0–17.0)
MCH: 32.4 pg (ref 26.0–34.0)
MCHC: 33.2 g/dL (ref 30.0–36.0)
MCV: 97.7 fL (ref 80.0–100.0)
Platelets: 216 10*3/uL (ref 150–400)
RBC: 4.26 MIL/uL (ref 4.22–5.81)
RDW: 13.2 % (ref 11.5–15.5)
WBC: 8.2 10*3/uL (ref 4.0–10.5)
nRBC: 0 % (ref 0.0–0.2)

## 2023-09-29 LAB — BASIC METABOLIC PANEL
Anion gap: 9 (ref 5–15)
BUN: 21 mg/dL (ref 8–23)
CO2: 24 mmol/L (ref 22–32)
Calcium: 9.1 mg/dL (ref 8.9–10.3)
Chloride: 107 mmol/L (ref 98–111)
Creatinine, Ser: 1.23 mg/dL (ref 0.61–1.24)
GFR, Estimated: >60 mL/min (ref >60)
Glucose, Bld: 102 mg/dL — ABNORMAL HIGH (ref 70–99)
Potassium: 4.4 mmol/L (ref 3.5–5.1)
Sodium: 140 mmol/L (ref 135–145)

## 2023-10-01 ENCOUNTER — Other Ambulatory Visit: Payer: Self-pay

## 2023-10-01 ENCOUNTER — Ambulatory Visit (HOSPITAL_COMMUNITY): Payer: 59 | Admitting: Medical

## 2023-10-01 ENCOUNTER — Encounter (HOSPITAL_COMMUNITY): Payer: Self-pay | Admitting: Urology

## 2023-10-01 ENCOUNTER — Encounter (HOSPITAL_COMMUNITY)
Admission: RE | Disposition: A | Discharge status: Home / Self Care | Payer: Self-pay | Source: Home / Self Care | Attending: Urology

## 2023-10-01 ENCOUNTER — Ambulatory Visit (HOSPITAL_COMMUNITY): Payer: 59 | Admitting: Certified Registered Nurse Anesthetist

## 2023-10-01 ENCOUNTER — Ambulatory Visit (HOSPITAL_COMMUNITY): Payer: 59

## 2023-10-01 ENCOUNTER — Ambulatory Visit (HOSPITAL_COMMUNITY)
Admission: RE | Admit: 2023-10-01 | Discharge: 2023-10-01 | Disposition: A | Discharge status: Home / Self Care | Payer: 59 | Attending: Urology | Admitting: Urology

## 2023-10-01 DIAGNOSIS — E66813 Obesity, class 3: Secondary | ICD-10-CM | POA: Insufficient documentation

## 2023-10-01 DIAGNOSIS — N202 Calculus of kidney with calculus of ureter: Secondary | ICD-10-CM

## 2023-10-01 DIAGNOSIS — G4730 Sleep apnea, unspecified: Secondary | ICD-10-CM | POA: Diagnosis not present

## 2023-10-01 DIAGNOSIS — Z8551 Personal history of malignant neoplasm of bladder: Secondary | ICD-10-CM | POA: Insufficient documentation

## 2023-10-01 DIAGNOSIS — Z6841 Body Mass Index (BMI) 40.0 and over, adult: Secondary | ICD-10-CM | POA: Insufficient documentation

## 2023-10-01 DIAGNOSIS — Z01818 Encounter for other preprocedural examination: Secondary | ICD-10-CM

## 2023-10-01 HISTORY — PX: CYSTOSCOPY WITH RETROGRADE PYELOGRAM, URETEROSCOPY AND STENT PLACEMENT: SHX5789

## 2023-10-01 SURGERY — CYSTOURETEROSCOPY, WITH RETROGRADE PYELOGRAM AND STENT INSERTION
Anesthesia: General | Laterality: Bilateral

## 2023-10-01 MED ORDER — OXYCODONE HCL 5 MG/5ML PO SOLN: 5.0000 mg | Freq: Once | ORAL | Status: DC | PRN

## 2023-10-01 MED ORDER — LIDOCAINE 2% (20 MG/ML) 5 ML SYRINGE
INTRAMUSCULAR | Status: DC | PRN
  Administered 2023-10-01: 80 mg via INTRAVENOUS

## 2023-10-01 MED ORDER — ORAL CARE MOUTH RINSE
15.0000 mL | Freq: Once | OROMUCOSAL | Status: AC
Start: 2023-10-01 — End: 2023-10-01

## 2023-10-01 MED ORDER — DEXAMETHASONE SODIUM PHOSPHATE 4 MG/ML IJ SOLN
INTRAMUSCULAR | Status: DC | PRN
  Administered 2023-10-01: 5 mg via INTRAVENOUS

## 2023-10-01 MED ORDER — EPHEDRINE 5 MG/ML INJ
INTRAVENOUS | Status: AC
  Filled 2023-10-01: qty 5

## 2023-10-01 MED ORDER — ONDANSETRON HCL 4 MG/2ML IJ SOLN
INTRAMUSCULAR | Status: AC
  Filled 2023-10-01: qty 2

## 2023-10-01 MED ORDER — KETOROLAC TROMETHAMINE 10 MG PO TABS: 10.0000 mg | ORAL_TABLET | Freq: Three times a day (TID) | ORAL | 0 refills | Status: AC | PRN

## 2023-10-01 MED ORDER — FENTANYL CITRATE (PF) 100 MCG/2ML IJ SOLN
INTRAMUSCULAR | Status: AC
  Filled 2023-10-01: qty 2

## 2023-10-01 MED ORDER — DEXAMETHASONE SODIUM PHOSPHATE 10 MG/ML IJ SOLN
INTRAMUSCULAR | Status: AC
  Filled 2023-10-01: qty 1

## 2023-10-01 MED ORDER — AMISULPRIDE (ANTIEMETIC) 5 MG/2ML IV SOLN: 10.0000 mg | Freq: Once | INTRAVENOUS | Status: DC | PRN

## 2023-10-01 MED ORDER — PHENYLEPHRINE 80 MCG/ML (10ML) SYRINGE FOR IV PUSH (FOR BLOOD PRESSURE SUPPORT)
PREFILLED_SYRINGE | INTRAVENOUS | Status: DC | PRN
  Administered 2023-10-01: 80 ug via INTRAVENOUS
  Administered 2023-10-01: 120 ug via INTRAVENOUS

## 2023-10-01 MED ORDER — ACETAMINOPHEN 500 MG PO TABS
1000.0000 mg | ORAL_TABLET | Freq: Once | ORAL | Status: AC
  Administered 2023-10-01: 1000 mg via ORAL
  Filled 2023-10-01: qty 2

## 2023-10-01 MED ORDER — OXYCODONE HCL 5 MG PO TABS: 5.0000 mg | ORAL_TABLET | Freq: Once | ORAL | Status: DC | PRN

## 2023-10-01 MED ORDER — PHENYLEPHRINE 80 MCG/ML (10ML) SYRINGE FOR IV PUSH (FOR BLOOD PRESSURE SUPPORT)
PREFILLED_SYRINGE | INTRAVENOUS | Status: AC
  Filled 2023-10-01: qty 10

## 2023-10-01 MED ORDER — LACTATED RINGERS IV SOLN: INTRAVENOUS | Status: DC

## 2023-10-01 MED ORDER — LIDOCAINE HCL (PF) 2 % IJ SOLN
INTRAMUSCULAR | Status: AC
  Filled 2023-10-01: qty 5

## 2023-10-01 MED ORDER — IOHEXOL 300 MG/ML  SOLN
INTRAMUSCULAR | Status: DC | PRN
  Administered 2023-10-01: 12 mL

## 2023-10-01 MED ORDER — OXYCODONE HCL 5 MG PO TABS: 5.0000 mg | ORAL_TABLET | Freq: Every evening | ORAL | 0 refills | Status: AC | PRN

## 2023-10-01 MED ORDER — MIDAZOLAM HCL 2 MG/2ML IJ SOLN
INTRAMUSCULAR | Status: DC | PRN
  Administered 2023-10-01: 2 mg via INTRAVENOUS

## 2023-10-01 MED ORDER — CHLORHEXIDINE GLUCONATE 0.12 % MT SOLN
15.0000 mL | Freq: Once | OROMUCOSAL | Status: AC
  Administered 2023-10-01: 15 mL via OROMUCOSAL

## 2023-10-01 MED ORDER — PROPOFOL 10 MG/ML IV BOLUS
INTRAVENOUS | Status: DC | PRN
  Administered 2023-10-01: 200 mg via INTRAVENOUS

## 2023-10-01 MED ORDER — HYDROMORPHONE HCL 1 MG/ML IJ SOLN
0.2500 mg | INTRAMUSCULAR | Status: DC | PRN
Start: 2023-10-01 — End: 2023-10-01

## 2023-10-01 MED ORDER — FENTANYL CITRATE (PF) 100 MCG/2ML IJ SOLN
INTRAMUSCULAR | Status: DC | PRN
  Administered 2023-10-01: 50 ug via INTRAVENOUS
  Administered 2023-10-01: 25 ug via INTRAVENOUS
  Administered 2023-10-01 (×2): 50 ug via INTRAVENOUS
  Administered 2023-10-01: 25 ug via INTRAVENOUS

## 2023-10-01 MED ORDER — SODIUM CHLORIDE 0.9 % IR SOLN
Status: DC | PRN
  Administered 2023-10-01: 3000 mL via INTRAVESICAL

## 2023-10-01 MED ORDER — ONDANSETRON HCL 4 MG/2ML IJ SOLN: 4.0000 mg | Freq: Once | INTRAMUSCULAR | Status: DC | PRN

## 2023-10-01 MED ORDER — CEPHALEXIN 500 MG PO CAPS: 500.0000 mg | ORAL_CAPSULE | Freq: Two times a day (BID) | ORAL | 0 refills | Status: AC

## 2023-10-01 MED ORDER — ONDANSETRON HCL 4 MG/2ML IJ SOLN
INTRAMUSCULAR | Status: DC | PRN
  Administered 2023-10-01: 4 mg via INTRAVENOUS

## 2023-10-01 MED ORDER — MIDAZOLAM HCL 2 MG/2ML IJ SOLN
INTRAMUSCULAR | Status: AC
  Filled 2023-10-01: qty 2

## 2023-10-01 MED ORDER — GENTAMICIN SULFATE 40 MG/ML IJ SOLN
5.0000 mg/kg | INTRAVENOUS | Status: AC
  Administered 2023-10-01: 490 mg via INTRAVENOUS
  Filled 2023-10-01: qty 12.25

## 2023-10-01 SURGICAL SUPPLY — 23 items
BAG URO CATCHER STRL LF (MISCELLANEOUS) ×1 IMPLANT
BASKET LASER NITINOL 1.9FR (BASKET) IMPLANT
CATH URETL OPEN END 6FR 70 (CATHETERS) ×1 IMPLANT
CLOTH BEACON ORANGE TIMEOUT ST (SAFETY) ×1 IMPLANT
EXTRACTOR STONE 1.7FRX115CM (UROLOGICAL SUPPLIES) IMPLANT
GLOVE SURG LX STRL 7.5 STRW (GLOVE) ×1 IMPLANT
GOWN STRL REUS W/ TWL XL LVL3 (GOWN DISPOSABLE) ×1 IMPLANT
GOWN STRL REUS W/TWL XL LVL3 (GOWN DISPOSABLE) ×1
GUIDEWIRE ANG ZIPWIRE 038X150 (WIRE) ×1 IMPLANT
GUIDEWIRE STR DUAL SENSOR (WIRE) ×1 IMPLANT
KIT TURNOVER KIT A (KITS) IMPLANT
LASER FIB FLEXIVA PULSE ID 365 (Laser) IMPLANT
MANIFOLD NEPTUNE II (INSTRUMENTS) ×1 IMPLANT
PACK CYSTO (CUSTOM PROCEDURE TRAY) ×1 IMPLANT
PAD PREP 24X48 CUFFED NSTRL (MISCELLANEOUS) ×1 IMPLANT
SHEATH NAVIGATOR HD 11/13X28 (SHEATH) IMPLANT
SHEATH NAVIGATOR HD 11/13X36 (SHEATH) IMPLANT
STENT POLARIS 5FRX26 (STENTS) IMPLANT
TRACTIP FLEXIVA PULS ID 200XHI (Laser) IMPLANT
TRACTIP FLEXIVA PULSE ID 200 (Laser) ×1
TUBE PU 8FR 16IN ENFIT (TUBING) ×1 IMPLANT
TUBING CONNECTING 10 (TUBING) ×1 IMPLANT
TUBING UROLOGY SET (TUBING) ×1 IMPLANT

## 2023-10-01 NOTE — Discharge Instructions (Addendum)
1 - You may have urinary urgency (bladder spasms) and bloody urine on / off with stent in place. This is normal.  2 - Remove tethered stents on Monday morning at home by pulling on string, then blue-white plastic tubing, and discarding. Office is open Monday if any issues arise.   3 - Call MD or go to ER for fever >102, severe pain / nausea / vomiting not relieved by medications, or acute change in medical status

## 2023-10-01 NOTE — Transfer of Care (Signed)
Immediate Anesthesia Transfer of Care Note  Patient: Joel [name]  Procedure(s) Performed: CYSTOSCOPY WITH BILATERAL RETROGRADE PYELOGRAM, BILATERAL URETEROSCOPY, HOLMIUM LASER LITHOTRIPSY,  AND BILATERAL URETERAL STENT EXCHANGE (Bilateral)  Patient Location: PACU  Anesthesia Type:General  Level of Consciousness: drowsy  Airway & Oxygen Therapy: Patient Spontanous Breathing and Patient connected to face mask  Post-op Assessment: Report given to RN and Post -op Vital signs reviewed and stable  Post vital signs: Reviewed and stable  Last Vitals:  Vitals Value Taken Time  BP 128/76 10/01/23 1039  Temp    Pulse 65 10/01/23 1040  Resp 16 10/01/23 1040  SpO2 92 % 10/01/23 1040  Vitals shown include unfiled device data.  Last Pain:  Vitals:   10/01/23 0721  TempSrc: Oral  PainSc: 0-No pain      Patients Stated Pain Goal: 6 (10/01/23 0721)  Complications: No notable events documented.

## 2023-10-01 NOTE — H&P (Signed)
Joel [name] is an 62 y.o. male.    Chief Complaint: Pre-OP BILATERAL Ureteroscopic Stone Manipulation  HPI:   1 - Urolithiasis - Rt prox 5mm ureteral, Lt lower 1cm renal (no hydro) by CT at [facility] 9/11 per report (images NOT avail for review). UA withtou infecitous parameters, Cr 1.5. KUB 9/17 with Rt ureteral stone now distal ureter (femoral head level), colic controlled.    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 Cysto 2018 without recurrence.    PMH sig for obesity, facial hemangioma. NO PCP at present / does not get regular primary care. 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    Today " Joel [name] " is seen to proceed with BILATERAL ureteroscopic stone manipulation. No interval fevers. Most recent UA without infectious parameters. He underwent attempt on 09/03/23, but ureteral caliber did not allow stone removal, now s/p passive dilation from stents.   Past Medical History:  Diagnosis Date   Bladder cancer (HCC) 04/2004   Cancer [facility])    History of kidney stones    Sleep apnea    has cpap    Past Surgical History:  Procedure Laterality Date   CYSTOSCOPY/URETEROSCOPY/HOLMIUM LASER/STENT PLACEMENT Bilateral 09/03/2023   Procedure: CYSTOSCOPY, BILATERAL DIAGNOSTIC URETEROSCOPY, BILATERAL RETROGRADE PYELOGRAM, BILATERAL URETERAL STENT PLACEMENT;  Surgeon: Loletta Parish., MD;  Location: [facility];  Service: Urology;  Laterality: Bilateral;   lipoma removed from neck     SHOULDER SURGERY Right 2022   surgery for bladder cancer      No family history on file. Social History:  reports that he has never smoked. He has never used smokeless tobacco. He reports that he does not drink alcohol and does not use drugs.  Allergies: No Known Allergies  No medications prior to admission.    Results for orders placed or performed during the hospital encounter of 09/29/23 (from the past 48  hour(s))  CBC per protocol     Status: None   Collection Time: 09/29/23  9:46 AM  Result Value Ref Range   WBC 8.2 4.0 - 10.5 K/uL   RBC 4.26 4.22 - 5.81 MIL/uL   Hemoglobin 13.8 13.0 - 17.0 g/dL   HCT 81.1 91.4 - 78.2 %   MCV 97.7 80.0 - 100.0 fL   MCH 32.4 26.0 - 34.0 pg   MCHC 33.2 30.0 - 36.0 g/dL   RDW 95.6 21.3 - 08.6 %   Platelets 216 150 - 400 K/uL   nRBC 0.0 0.0 - 0.2 %    Comment: Performed at [facility], [address], [place], [place] [address]  Basic metabolic panel per protocol     Status: Abnormal   Collection Time: 09/29/23  9:46 AM  Result Value Ref Range   Sodium 140 135 - 145 mmol/L   Potassium 4.4 3.5 - 5.1 mmol/L   Chloride 107 98 - 111 mmol/L   CO2 24 22 - 32 mmol/L   Glucose, Bld 102 (H) 70 - 99 mg/dL    Comment: Glucose reference range applies only to samples taken after fasting for at least 8 hours.   BUN 21 8 - 23 mg/dL   Creatinine, Ser 9.62 0.61 - 1.24 mg/dL   Calcium 9.1 8.9 - 95.2 mg/dL   GFR, Estimated >84 >13 mL/min    Comment: (NOTE) Calculated using the CKD-EPI Creatinine Equation (2021)    Anion gap 9 5 - 15  Comment: Performed at [facility], [address], [place], [place] [address]   No results found.  Review of Systems  Constitutional:  Negative for chills and fever.  Genitourinary:  Positive for urgency.  All other systems reviewed and are negative.   There were no vitals taken for this visit. Physical Exam Vitals reviewed.  HENT:     Head: Normocephalic.  Eyes:     Pupils: Pupils are equal, round, and reactive to light.  Cardiovascular:     Rate and Rhythm: Normal rate.  Pulmonary:     Effort: Pulmonary effort is normal.  Abdominal:     General: Abdomen is flat.  Genitourinary:    Comments: No CVAT at present Musculoskeletal:        General: Normal range of motion.     Cervical back: Normal range of motion.  Skin:    General: Skin is warm.  Neurological:     General: No  focal deficit present.     Mental Status: He is alert.  Psychiatric:        Mood and Affect: Mood normal.      Assessment/Plan  Proceed as planned with BILATERAL ureteroscopic stone manipulation. Risks, benefits, alternatives, expected peri-op course discussed preiously and reiterated today.    Loletta Parish., MD 10/01/2023, 5:23 AM

## 2023-10-01 NOTE — Anesthesia Postprocedure Evaluation (Signed)
Anesthesia Post Note  Patient: Carney Harder  Procedure(s) Performed: CYSTOSCOPY WITH BILATERAL RETROGRADE PYELOGRAM, BILATERAL URETEROSCOPY, HOLMIUM LASER LITHOTRIPSY,  AND BILATERAL URETERAL STENT EXCHANGE (Bilateral)     Patient location during evaluation: PACU Anesthesia Type: General Level of consciousness: awake and alert, oriented and patient cooperative Pain management: pain level controlled Vital Signs Assessment: post-procedure vital signs reviewed and stable Respiratory status: spontaneous breathing, nonlabored ventilation and respiratory function stable Cardiovascular status: blood pressure returned to baseline and stable Postop Assessment: no apparent nausea or vomiting Anesthetic complications: no   No notable events documented.  Last Vitals:  Vitals:   10/01/23 1045 10/01/23 1100  BP: 132/87 138/81  Pulse: 62 62  Resp: 13 19  Temp:    SpO2: 93% 91%    Last Pain:  Vitals:   10/01/23 1100  TempSrc:   PainSc: 0-No pain                 Lannie Fields

## 2023-10-01 NOTE — Anesthesia Preprocedure Evaluation (Addendum)
Anesthesia Evaluation  Patient identified by MRN, date of birth, ID band Patient awake    Reviewed: Allergy & Precautions, NPO status , Patient's Chart, lab work & pertinent test results  Airway Mallampati: III  TM Distance: >3 FB Neck ROM: Full    Dental  (+) Teeth Intact, Dental Advisory Given   Pulmonary sleep apnea and Continuous Positive Airway Pressure Ventilation    Pulmonary exam normal breath sounds clear to auscultation       Cardiovascular negative cardio ROS Normal cardiovascular exam Rhythm:Regular Rate:Normal     Neuro/Psych negative neurological ROS  negative psych ROS   GI/Hepatic negative GI ROS, Neg liver ROS,,,  Endo/Other    Class 3 obesityBMI 40  Renal/GU Renal disease (renal stones)  negative genitourinary   Musculoskeletal negative musculoskeletal ROS (+)    Abdominal  (+) + obese  Peds  Hematology negative hematology ROS (+)   Anesthesia Other Findings   Reproductive/Obstetrics negative OB ROS                             Anesthesia Physical Anesthesia Plan  ASA: 3  Anesthesia Plan: General   Post-op Pain Management: Tylenol PO (pre-op)*   Induction: Intravenous  PONV Risk Score and Plan: 2 and Ondansetron, Dexamethasone, Midazolam and Treatment may vary due to age or medical condition  Airway Management Planned: LMA  Additional Equipment: None  Intra-op Plan:   Post-operative Plan: Extubation in OR  Informed Consent: I have reviewed the patients History and Physical, chart, labs and discussed the procedure including the risks, benefits and alternatives for the proposed anesthesia with the patient or authorized representative who has indicated his/her understanding and acceptance.     Dental advisory given  Plan Discussed with: CRNA  Anesthesia Plan Comments:        Anesthesia Quick Evaluation

## 2023-10-01 NOTE — Brief Op Note (Signed)
10/01/2023  10:25 AM  PATIENT:  Joel [name]  62 y.o. male  PRE-OPERATIVE DIAGNOSIS:  BILATERAL RENAL AND URETERAL CALCULUS  POST-OPERATIVE DIAGNOSIS:  BILATERAL RENAL AND URETERAL CALCULUS  PROCEDURE:  Procedure(s) with comments: CYSTOSCOPY WITH BILATERAL RETROGRADE PYELOGRAM, BILATERAL URETEROSCOPY, HOLMIUM LASER LITHOTRIPSY,  AND BILATERAL URETERAL STENT EXCHANGE (Bilateral) - 75 MINUTES  SURGEON:  Surgeons and Role:    * Decklan Mau, Delbert Phenix., MD - Primary  PHYSICIAN ASSISTANT:   ASSISTANTS: none   ANESTHESIA:   general  EBL:  0 mL   BLOOD ADMINISTERED:none  DRAINS: none   LOCAL MEDICATIONS USED:  NONE  SPECIMEN:  Source of Specimen:  bilateral renal / ureteral stone fragments  DISPOSITION OF SPECIMEN:   [facility]  COUNTS:  YES  TOURNIQUET:  * No tourniquets in log *  DICTATION: .Other Dictation: Dictation Number [id number]  PLAN OF CARE: Discharge to home after PACU  PATIENT DISPOSITION:  PACU - hemodynamically stable.   Delay start of Pharmacological VTE agent (>24hrs) due to surgical blood loss or risk of bleeding: yes

## 2023-10-01 NOTE — Op Note (Unsigned)
NAMEMARKI, Joel Joel [name]: [id number] ACCOUNT Joel [name]: [id number] DATE OF BIRTH: [birth year] FACILITY: [facility] LOCATION: [facility] PHYSICIAN: Sebastian Ache, MD  Operative Report   DATE OF PROCEDURE: 10/01/2023  PREOPERATIVE DIAGNOSES:  Right ureteral and left renal stone, status post prior stenting.  PROCEDURE PERFORMED: 1.  Cystoscopy, bilateral retrograde pyelograms interpretation. 2.  Bilateral ureteroscopy with laser lithotripsy. 3.  Exchange of bilateral ureteral stents.  ESTIMATED BLOOD LOSS:  Nil.  COMPLICATIONS:  None.  SPECIMENS:  Bilateral renal and ureteral stone fragments for composition analysis.  FINDINGS: 1.  Left lower pole renal stone approximately 9 mm. 2.  Resolution of all accessible stone fragments large than one-third mm on the left side. 3.  Successful replacement of left ureteral stent . 4.  Interval passage of right mid ureteral stone. 5.  Scattered small papillary tip calcifications on the right side. 6.  Successful replacement of right ureteral stent.  INDICATIONS:  The patient is a very pleasant 62 year old male who was found on workup of colicky flank pain to have a right distal ureteral stone and left renal stone at the [facility] early last month.  He was seen in the office as an acute on-call  work in.  His colic was somewhat difficult to control.  He was interested in achieving stone-free status and underwent attempted bilateral ureteroscopy last month.  His ureters looked somewhat narrow in caliber and not allow visualization of either side  stone.  Therefore, bilateral stents were placed for passive dilation.  He presents for a second-stage procedure today.  Informed consent was obtained and placed in medical record.  DESCRIPTION OF PROCEDURE:  The patient being identified and verified.  Procedure being bilateral ureteral stent exchange was confirmed.  Procedure timeout was performed.  Intravenous antibiotics were administered.   General LMA anesthesia introduced.  The  patient was placed into a low lithotomy position.  Sterile field was created, prepped and draped the patient's penis, perineum, and proximal thighs using iodine.  Cystourethroscopy was performed using a 21-French rigid cystoscope with offset lens.   Inspection of the urinary bladder revealed the distal end of bilateral stents in situ.  Joel [name] papillary lesions or calcifications were noted.  The distal end of the left stent was grasped brought to the level of the urethral meatus.  A 0.038 ZIPwire was  advanced to the level of the lower pole set aside as a safety wire.  The stent was removed.  Similarly, the distal end of the right stent was grasped, brought to the level of the urethra meatus, a 0.038 ZIPwire was advanced to the level of the lower pole  stent was removed and set aside as a safety wire.  An 8-French feeding tube was placed in the urinary bladder for pressure release.  Next, a semi-rigid ureteroscopy was performed in the distal four-fifths left ureter alongside as a separate sensory  working wire.  Joel [name] mucosal abnormalities were found whatsoever.  The ureteral caliber was much more capacious having had interval stenting.  Similarly semirigid ureteroscopy was performed in the distal four-fifths of the right ureter alongside a separate  sensory working wire.  Joel [name] calcifications were noted whatsoever.  Very careful inspection was made of the distal right ureter as I was anticipating to find a stone in this location; however, none was present.  At this point, it was felt to likely  represent either interval passage alongside the stent or retrograde positioning.  The semirigid scope was then exchanged for the medium-length ureteral access  sheath at the level of the proximal ureter using fluoroscopic guidance and a flexible digital  ureteroscope was performed in the proximal ureter.  Systematic inspection of the right kidney including all calyces x3.  The original  5-mm stone was not seen whatsoever.  There were several small papillary tip calcifications on the right side.  These  were amenable to simple laser ablation using settings of 1 joule and 10 Hz.  All stones larger than one-third mm had been irrigated via this technique.  The access sheath was removed under continuous vision.  Joel [name] significant mucosal abnormalities were  found.  Next, the access sheath was placed over the left sensory working wire at the level of the proximal left ureter using advance fluoroscopic guidance and flexible digital ureteroscope was performed in the proximal left ureter.  Systematic inspection  of the left kidney all calyces x3.  There was a dominant stone in the lower pole, estimated to be approximately 9 mm.  It was much too large for simple basketing.  As such, holmium laser energy applied to the stone using 0.2 joules and 20 Hz using a  dusting technique and approximately 30% of the stone was dusted, 70% fragmented with the fragments being amenable with the basketing escape basked.  The fragments were removed and sent for composition analysis.  Following this, complete resolution of  accessible stone fragments on the left side, larger than one-third mm the access sheath was removed under continuous vision.  Joel [name] significant mucosal abnormalities were found.  Given the bilateral nature of the surgery today, it was felt that brief  interval stenting with tethered stent would be most prudent.  As such, new 5 x 26 Polaris type stents were placed as a safety wires bilaterally using fluoroscopic guidance.  Good proximal and distal planes were noted.  Tethers were left in place, tied  together, trimmed to length, and taped to the dorsum penis.  The procedure was them terminated.  The patient tolerated the procedure well.  Joel [name] immediate perioperative complications.  The patient was taken to postanesthesia care unit in stable condition.   Plan for discharge home.   PUS D: 10/01/2023  10:30:32 am T: 10/01/2023 11:46:00 am  JOB: [id number]/ [id number]

## 2023-10-01 NOTE — Anesthesia Procedure Notes (Signed)
Procedure Name: LMA Insertion Date/Time: 10/01/2023 9:30 AM  Performed by: Vanessa Dyer, CRNAPre-anesthesia Checklist: Emergency Drugs available, Patient identified, Suction available and Patient being monitored Patient Re-evaluated:Patient Re-evaluated prior to induction Oxygen Delivery Method: Circle system utilized Preoxygenation: Pre-oxygenation with 100% oxygen Induction Type: IV induction Ventilation: Mask ventilation without difficulty LMA: LMA inserted LMA Size: 5.0 Number of attempts: 1 Placement Confirmation: positive ETCO2 and breath sounds checked- equal and bilateral Tube secured with: Tape Dental Injury: Teeth and Oropharynx as per pre-operative assessment

## 2023-10-02 ENCOUNTER — Encounter (HOSPITAL_COMMUNITY): Payer: Self-pay | Admitting: Urology
